# Patient Record
Sex: Female | Born: 1965 | Race: Black or African American | Hispanic: No | Marital: Single | Smoking: Never smoker
Health system: Southern US, Community
[De-identification: ages and names within clinical notes are randomized; demographics above are authoritative.]

## PROBLEM LIST (undated history)

## (undated) DIAGNOSIS — F419 Anxiety disorder, unspecified: Secondary | ICD-10-CM

## (undated) DIAGNOSIS — G4730 Sleep apnea, unspecified: Secondary | ICD-10-CM

## (undated) DIAGNOSIS — J45909 Unspecified asthma, uncomplicated: Secondary | ICD-10-CM

## (undated) DIAGNOSIS — K219 Gastro-esophageal reflux disease without esophagitis: Secondary | ICD-10-CM

## (undated) DIAGNOSIS — Z8719 Personal history of other diseases of the digestive system: Secondary | ICD-10-CM

## (undated) DIAGNOSIS — R7303 Prediabetes: Secondary | ICD-10-CM

## (undated) HISTORY — PX: FRACTURE SURGERY: SHX138

---

## 2020-05-23 ENCOUNTER — Other Ambulatory Visit: Payer: Self-pay | Admitting: Internal Medicine

## 2020-05-23 DIAGNOSIS — Z1231 Encounter for screening mammogram for malignant neoplasm of breast: Secondary | ICD-10-CM

## 2020-05-25 ENCOUNTER — Other Ambulatory Visit: Payer: Self-pay

## 2020-05-25 ENCOUNTER — Ambulatory Visit
Admission: RE | Admit: 2020-05-25 | Discharge: 2020-05-25 | Disposition: A | Discharge status: Home / Self Care | Payer: 59 | Source: Ambulatory Visit | Attending: Internal Medicine | Admitting: Internal Medicine

## 2020-05-25 DIAGNOSIS — Z1231 Encounter for screening mammogram for malignant neoplasm of breast: Secondary | ICD-10-CM

## 2020-10-21 ENCOUNTER — Other Ambulatory Visit: Payer: Self-pay

## 2020-10-21 ENCOUNTER — Ambulatory Visit (HOSPITAL_COMMUNITY)
Admission: EM | Admit: 2020-10-21 | Discharge: 2020-10-21 | Disposition: A | Discharge status: Home / Self Care | Payer: Managed Care, Other (non HMO) | Attending: Physician Assistant | Admitting: Physician Assistant

## 2020-10-21 ENCOUNTER — Encounter (HOSPITAL_COMMUNITY): Payer: Self-pay

## 2020-10-21 DIAGNOSIS — R109 Unspecified abdominal pain: Secondary | ICD-10-CM | POA: Diagnosis present

## 2020-10-21 DIAGNOSIS — R059 Cough, unspecified: Secondary | ICD-10-CM | POA: Insufficient documentation

## 2020-10-21 DIAGNOSIS — R197 Diarrhea, unspecified: Secondary | ICD-10-CM | POA: Diagnosis not present

## 2020-10-21 DIAGNOSIS — R509 Fever, unspecified: Secondary | ICD-10-CM | POA: Diagnosis not present

## 2020-10-21 DIAGNOSIS — Z20822 Contact with and (suspected) exposure to covid-19: Secondary | ICD-10-CM | POA: Insufficient documentation

## 2020-10-21 DIAGNOSIS — J069 Acute upper respiratory infection, unspecified: Secondary | ICD-10-CM

## 2020-10-21 LAB — SARS CORONAVIRUS 2 (TAT 6-24 HRS): SARS Coronavirus 2: NEGATIVE

## 2020-10-21 MED ORDER — PROMETHAZINE-DM 6.25-15 MG/5ML PO SYRP: 5.0000 mL | ORAL_SOLUTION | Freq: Two times a day (BID) | ORAL | 0 refills | Status: AC | PRN

## 2020-10-21 MED ORDER — BENZONATATE 100 MG PO CAPS: 100.0000 mg | ORAL_CAPSULE | Freq: Three times a day (TID) | ORAL | 0 refills | Status: DC

## 2020-10-21 MED ORDER — ACETAMINOPHEN 325 MG PO TABS
650.0000 mg | ORAL_TABLET | Freq: Once | ORAL | Status: AC
  Administered 2020-10-21: 650 mg via ORAL

## 2020-10-21 MED ORDER — ACETAMINOPHEN 325 MG PO TABS
ORAL_TABLET | ORAL | Status: AC
  Filled 2020-10-21: qty 2

## 2020-10-21 NOTE — ED Provider Notes (Signed)
[facility]    CSN: [id number] Arrival date & time: 10/21/20  1028      History   Chief Complaint Chief Complaint  Patient presents with  . Abdominal Pain  . Fever  . Nasal Congestion    HPI Sharon Sparks is a 55 y.o. female.   Patient presents today with a 5-day history of URI symptoms.  Reports fever with T-max of 102 F, body aches, cough, nasal congestion, decreased appetite, abdominal pain, diarrhea.  She denies any chest pain, shortness of breath, dizziness, syncope.  She has tried ibuprofen and TheraFlu without improvement of symptoms.  Denies any known sick contacts.  She does have a history of allergies but is taking allergy medication as prescribed.  Denies history of smoking, asthma, COPD.  She is up-to-date on influenza and COVID-19 vaccinations including boosters.  She denies any recent antibiotic use.  She has missed work as a result of symptoms.     History reviewed. No pertinent past medical history.  There are no problems to display for this patient.   History reviewed. No pertinent surgical history.  OB History   No obstetric history on file.      Home Medications    Prior to Admission medications   Medication Sig Start Date End Date Taking? Authorizing Provider  benzonatate (TESSALON) 100 MG capsule Take 1 capsule (100 mg total) by mouth every 8 (eight) hours. 10/21/20  Yes Tylar Merendino K, PA-C  promethazine-dextromethorphan (PROMETHAZINE-DM) 6.25-15 MG/5ML syrup Take 5 mLs by mouth 2 (two) times daily as needed for cough. 10/21/20  Yes Thayer Embleton, Noberto Retort, PA-C    Family History History reviewed. No pertinent family history.  Social History Social History   Tobacco Use  . Smoking status: Never Smoker  . Smokeless tobacco: Never Used  Substance Use Topics  . Drug use: Never     Allergies   Patient has no known allergies.   Review of Systems Review of Systems  Constitutional: Positive for activity change, appetite change, fatigue  and fever.  HENT: Positive for congestion, postnasal drip and sinus pressure. Negative for sneezing and sore throat.   Respiratory: Positive for cough. Negative for shortness of breath.   Cardiovascular: Negative for chest pain.  Gastrointestinal: Positive for abdominal pain and diarrhea. Negative for nausea and vomiting.  Musculoskeletal: Positive for arthralgias and myalgias.  Neurological: Positive for headaches. Negative for dizziness and light-headedness.     Physical Exam Triage Vital Signs ED Triage Vitals  Enc Vitals Group     BP 10/21/20 1114 135/77     Pulse Rate 10/21/20 1114 (!) 105     Resp 10/21/20 1114 20     Temp 10/21/20 1114 (!) 100.5 F (38.1 C)     Temp src --      SpO2 10/21/20 1114 97 %     Weight --      Height --      Head Circumference --      Peak Flow --      Pain Score 10/21/20 1112 6     Pain Loc --      Pain Edu? --      Excl. in GC? --    No data found.  Updated Vital Signs BP 135/77   Pulse (!) 105   Temp (!) 100.5 F (38.1 C)   Resp 20   SpO2 97%   Visual Acuity Right Eye Distance:   Left Eye Distance:   Bilateral Distance:    Right  Eye Near:   Left Eye Near:    Bilateral Near:     Physical Exam Vitals reviewed.  Constitutional:      General: She is awake. She is not in acute distress.    Appearance: Normal appearance. She is not ill-appearing.     Comments: Very pleasant female appears stated age in no acute distress  HENT:     Head: Normocephalic and atraumatic.     Right Ear: Ear canal and external ear normal. A middle ear effusion is present. Tympanic membrane is not erythematous or bulging.     Left Ear: Ear canal and external ear normal. A middle ear effusion is present. Tympanic membrane is not erythematous or bulging.     Nose:     Right Sinus: Maxillary sinus tenderness and frontal sinus tenderness present.     Left Sinus: Maxillary sinus tenderness and frontal sinus tenderness present.     Mouth/Throat:      Pharynx: Uvula midline. No oropharyngeal exudate or posterior oropharyngeal erythema.     Comments: Moderate drainage in posterior oropharynx Cardiovascular:     Rate and Rhythm: Normal rate and regular rhythm.     Heart sounds: No murmur heard.   Pulmonary:     Effort: Pulmonary effort is normal.     Breath sounds: Normal breath sounds. No wheezing, rhonchi or rales.     Comments: Clear to auscultation bilaterally Lymphadenopathy:     Head:     Right side of head: No submental, submandibular or tonsillar adenopathy.     Left side of head: No submental, submandibular or tonsillar adenopathy.     Cervical: No cervical adenopathy.  Psychiatric:        Behavior: Behavior is cooperative.      UC Treatments / Results  Labs (all labs ordered are listed, but only abnormal results are displayed) Labs Reviewed  SARS CORONAVIRUS 2 (TAT 6-24 HRS)    EKG   Radiology No results found.  Procedures Procedures (including critical care time)  Medications Ordered in UC Medications  acetaminophen (TYLENOL) tablet 650 mg (650 mg Oral Given 10/21/20 1136)    Initial Impression / Assessment and Plan / UC Course  I have reviewed the triage vital signs and the nursing notes.  Pertinent labs & imaging results that were available during my care of the patient were reviewed by me and considered in my medical decision making (see chart for details).     Vital signs and physical exam reassuring today.  Suspect viral etiology given short duration of symptoms.  No indication for flu testing given patient has been symptomatic for 5 days and this would not change management.  Will test for COVID-19-results pending.  She was prescribed Promethazine DM for nocturnal cough and was instructed not to drive or drink alcohol with this medication.  Can use Tessalon for diurnal cough symptoms.  Recommended she drink plenty of fluid and eat a bland diet.  Recommended over-the-counter medication for symptom  management.  She was provided a work excuse note.  Strict return precautions given to which patient expressed understanding.  Final Clinical Impressions(s) / UC Diagnoses   Final diagnoses:  Viral URI with cough     Discharge Instructions     Use Promethazine DM up to 2 times a day as needed for cough.  This will make you sleepy so do not drive or drink alcohol with it.  You should only take this when you are out home.  You can use Tessalon  for cough symptoms during the day.  I recommend Flonase and Mucinex for additional symptom relief.  If you continue to have symptoms for another 5 to 7 days would consider antibiotics so please return for reevaluation.  We will contact you if you are positive for COVID-19.  If anything worsens please come back to be seen again.    ED Prescriptions    Medication Sig Dispense Auth. Provider   promethazine-dextromethorphan (PROMETHAZINE-DM) 6.25-15 MG/5ML syrup Take 5 mLs by mouth 2 (two) times daily as needed for cough. 118 mL Temiloluwa Laredo K, PA-C   benzonatate (TESSALON) 100 MG capsule Take 1 capsule (100 mg total) by mouth every 8 (eight) hours. 21 capsule Amber Guthridge K, PA-C     PDMP not reviewed this encounter.   Jeani Hawking, PA-C 10/21/20 1159

## 2020-10-21 NOTE — Discharge Instructions (Signed)
Use Promethazine DM up to 2 times a day as needed for cough.  This will make you sleepy so do not drive or drink alcohol with it.  You should only take this when you are out home.  You can use Tessalon for cough symptoms during the day.  I recommend Flonase and Mucinex for additional symptom relief.  If you continue to have symptoms for another 5 to 7 days would consider antibiotics so please return for reevaluation.  We will contact you if you are positive for COVID-19.  If anything worsens please come back to be seen again.

## 2020-10-21 NOTE — ED Triage Notes (Addendum)
Pt in with c/o congestion, fever tmax 101, and abdominal pain that has been going on since Monday. Pt also c/o vomiting and diarrhea  Pt took 800 mg ibuprofen around 8 am today, pt has also been taking theraflu

## 2021-07-04 ENCOUNTER — Other Ambulatory Visit: Payer: Self-pay

## 2021-07-04 ENCOUNTER — Emergency Department (HOSPITAL_BASED_OUTPATIENT_CLINIC_OR_DEPARTMENT_OTHER): Payer: Managed Care, Other (non HMO) | Admitting: Radiology

## 2021-07-04 ENCOUNTER — Encounter (HOSPITAL_BASED_OUTPATIENT_CLINIC_OR_DEPARTMENT_OTHER): Payer: Self-pay

## 2021-07-04 ENCOUNTER — Emergency Department (HOSPITAL_BASED_OUTPATIENT_CLINIC_OR_DEPARTMENT_OTHER)
Admission: EM | Admit: 2021-07-04 | Discharge: 2021-07-04 | Disposition: A | Discharge status: Home / Self Care | Payer: Managed Care, Other (non HMO) | Attending: Emergency Medicine | Admitting: Emergency Medicine

## 2021-07-04 DIAGNOSIS — J069 Acute upper respiratory infection, unspecified: Secondary | ICD-10-CM | POA: Diagnosis not present

## 2021-07-04 DIAGNOSIS — Z20822 Contact with and (suspected) exposure to covid-19: Secondary | ICD-10-CM | POA: Diagnosis not present

## 2021-07-04 DIAGNOSIS — R059 Cough, unspecified: Secondary | ICD-10-CM | POA: Diagnosis present

## 2021-07-04 LAB — RESP PANEL BY RT-PCR (FLU A&B, COVID) ARPGX2
Influenza A by PCR: NEGATIVE
Influenza B by PCR: NEGATIVE
SARS Coronavirus 2 by RT PCR: NEGATIVE

## 2021-07-04 MED ORDER — BENZONATATE 100 MG PO CAPS: 100.0000 mg | ORAL_CAPSULE | Freq: Once | ORAL | Status: DC

## 2021-07-04 MED ORDER — BENZONATATE 100 MG PO CAPS: 100.0000 mg | ORAL_CAPSULE | Freq: Three times a day (TID) | ORAL | 0 refills | Status: AC

## 2021-07-04 MED ORDER — IPRATROPIUM-ALBUTEROL 0.5-2.5 (3) MG/3ML IN SOLN
3.0000 mL | Freq: Once | RESPIRATORY_TRACT | Status: AC
  Administered 2021-07-04: 3 mL via RESPIRATORY_TRACT
  Filled 2021-07-04: qty 3

## 2021-07-04 MED ORDER — ACETAMINOPHEN 500 MG PO TABS
1000.0000 mg | ORAL_TABLET | Freq: Once | ORAL | Status: AC
  Administered 2021-07-04: 1000 mg via ORAL
  Filled 2021-07-04: qty 2

## 2021-07-04 MED ORDER — HYDROCOD POLST-CPM POLST ER 10-8 MG/5ML PO SUER
5.0000 mL | Freq: Once | ORAL | Status: AC
Start: 2021-07-04 — End: 2021-07-04
  Administered 2021-07-04: 5 mL via ORAL
  Filled 2021-07-04: qty 5

## 2021-07-04 NOTE — ED Triage Notes (Signed)
Patient here POV from Home with Flu-Like Symptoms.  Patient has had Cough, Body Aches, Fever, Mild Nausea for a few days.   NAD Noted during Triage. A&Ox4. GCS 15. Ambulatory.

## 2021-07-04 NOTE — ED Provider Notes (Signed)
[facility] Provider Note   CSN: [id number] Arrival date & time: 07/04/21  1335     History  Chief Complaint  Patient presents with   Cough    Hli Rent is a 55 y.o. female with a past medical history of asthma presenting today with a complaint of URI symptoms since Friday.  On Friday she began to have nasal congestion, headache and measured a fever of 103.  This continued into Saturday and Sunday.  On Sunday she began to have a cough that she describes as dry.  Has been using over-the-counter medications such as TheraFlu, Alka-Seltzer, Tylenol and teas.  Has not been using her inhaler more than usual.  No known sick contacts.  No recent surgery, recent travel, estrogen use or history of DVT.   Home Medications Prior to Admission medications   Medication Sig Start Date End Date Taking? Authorizing Provider  benzonatate (TESSALON) 100 MG capsule Take 1 capsule (100 mg total) by mouth every 8 (eight) hours. Patient not taking: Reported on 07/04/2021 10/21/20   Raspet, Derry Skill, PA-C  promethazine-dextromethorphan (PROMETHAZINE-DM) 6.25-15 MG/5ML syrup Take 5 mLs by mouth 2 (two) times daily as needed for cough. Patient not taking: Reported on 07/04/2021 10/21/20   Raspet, Derry Skill, PA-C      Allergies    Patient has no known allergies.    Review of Systems   Review of Systems  Constitutional:  Positive for chills and fever.  HENT:  Positive for congestion and sore throat. Negative for trouble swallowing.   Respiratory:  Positive for cough. Negative for shortness of breath.   Cardiovascular:  Negative for chest pain and palpitations.  Musculoskeletal:  Negative for myalgias.   Physical Exam Updated Vital Signs BP (!) 162/83    Pulse 94    Temp 99.4 F (37.4 C) (Oral)    Resp 18    Ht 5\' 4"  (1.626 m)    Wt 108 kg    SpO2 95%    BMI 40.85 kg/m  Physical Exam Vitals and nursing note reviewed.  Constitutional:      General: She is not in acute distress.     Appearance: Normal appearance. She is not ill-appearing.  HENT:     Head: Normocephalic and atraumatic.     Nose: Congestion present.     Mouth/Throat:     Mouth: Mucous membranes are moist.     Pharynx: Oropharynx is clear.  Eyes:     General: No scleral icterus.    Conjunctiva/sclera: Conjunctivae normal.  Cardiovascular:     Rate and Rhythm: Normal rate and regular rhythm.  Pulmonary:     Effort: Pulmonary effort is normal. No respiratory distress.     Breath sounds: No wheezing or rales.  Lymphadenopathy:     Cervical: No cervical adenopathy.  Skin:    General: Skin is warm and dry.     Findings: No rash.  Neurological:     Mental Status: She is alert.  Psychiatric:        Mood and Affect: Mood normal.    ED Results / Procedures / Treatments   Labs (all labs ordered are listed, but only abnormal results are displayed) Labs Reviewed  RESP PANEL BY RT-PCR (FLU A&B, COVID) [id number]    EKG None  Radiology DG Chest 2 View  Result Date: 07/04/2021 CLINICAL DATA:  Cough EXAM: CHEST - 2 VIEW COMPARISON:  None. FINDINGS: The heart size and mediastinal contours are within normal limits. Both lungs are  clear. No pleural effusion. The visualized skeletal structures are unremarkable. IMPRESSION: No active cardiopulmonary disease. Electronically Signed   By: Macy Mis M.D.   On: 07/04/2021 14:10    Procedures Procedures   Medications Ordered in ED Medications  ipratropium-albuterol (DUONEB) 0.5-2.5 (3) MG/3ML nebulizer solution 3 mL (3 mLs Nebulization Given 07/04/21 1942)  chlorpheniramine-HYDROcodone (TUSSIONEX) 10-8 MG/5ML suspension 5 mL (5 mLs Oral Given 07/04/21 1940)  acetaminophen (TYLENOL) tablet 1,000 mg (1,000 mg Oral Given 07/04/21 2057)    ED Course/ Medical Decision Making/ A&P                           Medical Decision Making  Patient presents to the ED for concern of URI sx, this involves an extensive number of treatment options, and is a complaint that  carries with it a high risk of complications and morbidity.  The differential diagnosis includes but is not limited to COVID, flu, PE, pneumonia, bronchitis, pneumothorax, asthma exacerbation.   Co morbidities that complicate the patient evaluation  Asthma   Lab Tests:  I Ordered, and personally interpreted labs.  The pertinent results include a negative COVID and flu test.  Imaging Studies ordered:  I ordered imaging studies including Chest xray I independently visualized and interpreted imaging which was negative.  I agree with radiologist.   Medicines ordered and prescription drug management:  I ordered medication including DuoNeb and Tussinex for her symptoms. Reevaluation of the patient after these medicines showed that the patient stayed the same I have reviewed the patients home medicines and have made adjustments as needed   Test Considered:  D dimer.  Patient has no risk factors for DVT and cough is in the context of other URI symptoms.  Low suspicion and D-dimer deferred.   Problem List / ED Course:  URI  Dispostion:  After consideration of the diagnostic results and the patients response to treatment, I feel that the patent would benefit from Mucinex and Tessalon Perles for her symptoms.  She will continue to try and treat her symptoms with over-the-counter medications and return with any worsening.  She has also been given a primary care provider to follow-up with  Final Clinical Impression(s) / ED Diagnoses Final diagnoses:  Viral upper respiratory tract infection    Rx / DC Orders Results and diagnoses were explained to the patient. Return precautions discussed in full. Patient had no additional questions and expressed complete understanding.   This chart was dictated using voice recognition software.  Despite best efforts to proofread,  errors can occur which can change the documentation meaning.    Rhae Hammock, PA-C 07/04/21 2155    Deno Etienne, DO 07/04/21 2300

## 2021-07-04 NOTE — ED Notes (Signed)
Pt verbalizes understanding of discharge instructions. Opportunity for questioning and answers were provided. Pt discharged from ED to home.   ? ?

## 2021-07-04 NOTE — Discharge Instructions (Addendum)
Mucinex is a good medication for nasal congestion.  Tessalon Perles, benzonatate, as the other medication I sent to the pharmacy.  This can be helpful for a cough.  Robitussin is also a good option for a cough.  Follow-up with the clinic attached to these papers as needed and return if your symptoms worsen.

## 2021-08-22 ENCOUNTER — Ambulatory Visit: Payer: Managed Care, Other (non HMO) | Admitting: Emergency Medicine

## 2021-09-05 ENCOUNTER — Other Ambulatory Visit: Payer: Self-pay | Admitting: Otolaryngology

## 2021-09-05 DIAGNOSIS — J342 Deviated nasal septum: Secondary | ICD-10-CM

## 2021-09-05 DIAGNOSIS — G4733 Obstructive sleep apnea (adult) (pediatric): Secondary | ICD-10-CM

## 2021-09-05 DIAGNOSIS — J343 Hypertrophy of nasal turbinates: Secondary | ICD-10-CM

## 2021-09-05 DIAGNOSIS — R1314 Dysphagia, pharyngoesophageal phase: Secondary | ICD-10-CM

## 2021-09-05 DIAGNOSIS — K219 Gastro-esophageal reflux disease without esophagitis: Secondary | ICD-10-CM

## 2021-09-12 ENCOUNTER — Other Ambulatory Visit: Payer: Managed Care, Other (non HMO)

## 2021-09-29 ENCOUNTER — Ambulatory Visit
Admission: RE | Admit: 2021-09-29 | Discharge: 2021-09-29 | Disposition: A | Discharge status: Home / Self Care | Payer: Managed Care, Other (non HMO) | Source: Ambulatory Visit | Attending: Otolaryngology | Admitting: Otolaryngology

## 2021-09-29 DIAGNOSIS — J343 Hypertrophy of nasal turbinates: Secondary | ICD-10-CM

## 2021-09-29 DIAGNOSIS — Z9989 Dependence on other enabling machines and devices: Secondary | ICD-10-CM

## 2021-09-29 DIAGNOSIS — J342 Deviated nasal septum: Secondary | ICD-10-CM

## 2021-09-29 DIAGNOSIS — R1314 Dysphagia, pharyngoesophageal phase: Secondary | ICD-10-CM

## 2021-09-29 DIAGNOSIS — K219 Gastro-esophageal reflux disease without esophagitis: Secondary | ICD-10-CM

## 2021-11-14 NOTE — H&P (Signed)
HPI:  ? ?Sharon Sparks is a 56 y.o. female who presents as a new Patient.  ? ?Referring Provider: Self, A Referral ? ?Chief complaint: Nasal obstruction, difficulty swallowing. ? ?HPI: She has a history of nasal trauma as a teenager and had some sort of reconstructive surgery at the time. Since then she is finding it very difficult to breathe through the right side of her nose. She was diagnosed with sleep apnea last year and started on CPAP which she is using effectively every night. She has nasal CPAP and seems to do well with that. She also has a problem with swallowing. She has food getting stuck in her throat frequently. She had an upper and lower endoscopy a few years ago which was reportedly normal. She does suffer with chronic heartburn. She drinks 2 large cups of coffee every morning and wine periodically. She does not take any reflux medication. ? ?PMH/Meds/All/SocHx/FamHx/ROS:  ? ?History reviewed. No pertinent past medical history. ? ?History reviewed. No pertinent surgical history. ? ?No family history of bleeding disorders, wound healing problems or difficulty with anesthesia.  ? ?Social History  ? ?Socioeconomic History  ? Marital status: Single  ?Spouse name: Not on file  ? Number of children: Not on file  ? Years of education: Not on file  ? Highest education level: Not on file  ?Occupational History  ? Not on file  ?Tobacco Use  ? Smoking status: Never  ? Smokeless tobacco: Never  ?Substance and Sexual Activity  ? Alcohol use: Not Currently  ? Drug use: Not on file  ? Sexual activity: Not on file  ?Other Topics Concern  ? Not on file  ?Social History Narrative  ? Not on file  ? ?Social Determinants of Health  ? ?Financial Resource Strain: Not on file  ?Food Insecurity: Not on file  ?Transportation Needs: Not on file  ?Physical Activity: Not on file  ?Stress: Not on file  ?Social Connections: Not on file  ?Housing Stability: Not on file  ? ?Current Outpatient Medications:  ? buPROPion XL (WELLBUTRIN  XL) 150 MG 24 hr tablet, Take 1 tablet (150 mg total) by mouth every morning., Disp: , Rfl:  ? levocetirizine (XYZAL) 5 MG tablet, Take 1 tablet (5 mg total) by mouth nightly., Disp: , Rfl:  ? ?A complete ROS was performed with pertinent positives/negatives noted in the HPI. The remainder of the ROS are negative. ? ? ?Physical Exam:  ? ?Temp (!) 95.9 ?F (35.5 ?C)  Ht 1.626 m (5\' 4" )  Wt 109.5 kg (241 lb 6.4 oz)  BMI 41.44 kg/m?  ? ?General: Obese lady, in no distress, breathing easily. Normal affect. In a pleasant mood. ?Head: Normocephalic, atraumatic. No masses, or scars. ?Eyes: Pupils are equal, and reactive to light. Vision is grossly intact. No spontaneous or gaze nystagmus. ?Ears: Ear canals are clear. Tympanic membranes are intact, with normal landmarks and the middle ears are clear and healthy. ?Hearing: Grossly normal. ?Nose: Nasal cavities reveal a leftward posterior septal defect, with very large inferior turbinates causing obstruction bilaterally. Mucosa looks healthy and there is no exudate or polyps. ?Face: No masses or scars, facial nerve function is symmetric. ?Oral Cavity: No mucosal abnormalities are noted. Tongue with normal mobility. Dentition appears healthy. ?Oropharynx: Tonsils are symmetric. There are no mucosal masses identified. Tongue base appears normal and healthy. ?Larynx/Hypopharynx: deferred ?Chest: Deferred ?Neck: No palpable masses, no cervical adenopathy, no thyroid nodules or enlargement. ?Neuro: Cranial nerves II-XII with normal function. ?Balance: Normal gate. ?Other  findings: none. ? ?Independent Review of Additional Tests or Records:  ?none ? ?Procedures:  ?none ? ?Impression & Plans:  ?1. Obstructive sleep apnea syndrome. This is multifactorial including secondary to obesity and currently being treated effectively with CPAP. I would like to review the results of her sleep study but it is not available in our computer system. ? ?2. Nasal obstruction. She may benefit with  nasal septal/turbinate surgery but first I would like to review the sleep study and make sure that her sleep apnea is not severe and that this would not be a high risk surgical procedure. ? ?3. Dysphagia, recommend barium swallow. This is likely reflux related. We discussed the importance of cutting back on caffeine.We discussed causes of reflux, including lifestyle and dietary factors. Recommend strict avoidance of all tobacco, caffeine, alcohol, chocolate and peppermint. A reflux handout with more detailed instructions was provided to the patient.  ?

## 2021-12-26 ENCOUNTER — Encounter (HOSPITAL_COMMUNITY): Payer: Self-pay | Admitting: Otolaryngology

## 2021-12-26 ENCOUNTER — Other Ambulatory Visit: Payer: Self-pay

## 2021-12-27 ENCOUNTER — Ambulatory Visit (HOSPITAL_BASED_OUTPATIENT_CLINIC_OR_DEPARTMENT_OTHER): Payer: Managed Care, Other (non HMO) | Admitting: Anesthesiology

## 2021-12-27 ENCOUNTER — Observation Stay (HOSPITAL_COMMUNITY)
Admission: RE | Admit: 2021-12-27 | Discharge: 2021-12-28 | Disposition: A | Discharge status: Home / Self Care | Payer: Managed Care, Other (non HMO) | Attending: Otolaryngology | Admitting: Otolaryngology

## 2021-12-27 ENCOUNTER — Encounter (HOSPITAL_COMMUNITY)
Admission: RE | Disposition: A | Discharge status: Home / Self Care | Payer: Self-pay | Source: Home / Self Care | Attending: Otolaryngology

## 2021-12-27 ENCOUNTER — Other Ambulatory Visit: Payer: Self-pay

## 2021-12-27 ENCOUNTER — Encounter (HOSPITAL_COMMUNITY): Payer: Self-pay | Admitting: Otolaryngology

## 2021-12-27 ENCOUNTER — Ambulatory Visit (HOSPITAL_COMMUNITY): Payer: Managed Care, Other (non HMO) | Admitting: Anesthesiology

## 2021-12-27 DIAGNOSIS — J342 Deviated nasal septum: Principal | ICD-10-CM | POA: Insufficient documentation

## 2021-12-27 DIAGNOSIS — R7309 Other abnormal glucose: Secondary | ICD-10-CM | POA: Insufficient documentation

## 2021-12-27 DIAGNOSIS — Z9989 Dependence on other enabling machines and devices: Secondary | ICD-10-CM | POA: Diagnosis not present

## 2021-12-27 DIAGNOSIS — G4733 Obstructive sleep apnea (adult) (pediatric): Secondary | ICD-10-CM

## 2021-12-27 DIAGNOSIS — J343 Hypertrophy of nasal turbinates: Secondary | ICD-10-CM

## 2021-12-27 DIAGNOSIS — R1310 Dysphagia, unspecified: Secondary | ICD-10-CM | POA: Diagnosis not present

## 2021-12-27 HISTORY — DX: Sleep apnea, unspecified: G47.30

## 2021-12-27 HISTORY — DX: Unspecified asthma, uncomplicated: J45.909

## 2021-12-27 HISTORY — DX: Prediabetes: R73.03

## 2021-12-27 HISTORY — DX: Personal history of other diseases of the digestive system: Z87.19

## 2021-12-27 HISTORY — PX: NASAL SEPTOPLASTY W/ TURBINOPLASTY: SHX2070

## 2021-12-27 HISTORY — DX: Anxiety disorder, unspecified: F41.9

## 2021-12-27 HISTORY — DX: Gastro-esophageal reflux disease without esophagitis: K21.9

## 2021-12-27 LAB — CBC
HCT: 36.3 % (ref 36.0–46.0)
Hemoglobin: 11.9 g/dL — ABNORMAL LOW (ref 12.0–15.0)
MCH: 30 pg (ref 26.0–34.0)
MCHC: 32.8 g/dL (ref 30.0–36.0)
MCV: 91.4 fL (ref 80.0–100.0)
Platelets: 244 10*3/uL (ref 150–400)
RBC: 3.97 MIL/uL (ref 3.87–5.11)
RDW: 13.3 % (ref 11.5–15.5)
WBC: 4.1 10*3/uL (ref 4.0–10.5)
nRBC: 0 % (ref 0.0–0.2)

## 2021-12-27 LAB — GLUCOSE, CAPILLARY: Glucose-Capillary: 152 mg/dL — ABNORMAL HIGH (ref 70–99)

## 2021-12-27 SURGERY — SEPTOPLASTY, NOSE, WITH NASAL TURBINATE REDUCTION
Anesthesia: General | Site: Nose | Laterality: Bilateral

## 2021-12-27 MED ORDER — OXYMETAZOLINE HCL 0.05 % NA SOLN
2.0000 | NASAL | Status: DC
  Administered 2021-12-27: 2 via NASAL
  Filled 2021-12-27: qty 30

## 2021-12-27 MED ORDER — ONDANSETRON HCL 4 MG PO TABS: 4.0000 mg | ORAL_TABLET | ORAL | Status: DC | PRN

## 2021-12-27 MED ORDER — CHLORHEXIDINE GLUCONATE 0.12 % MT SOLN: 15.0000 mL | Freq: Once | OROMUCOSAL | Status: AC

## 2021-12-27 MED ORDER — ACETAMINOPHEN 500 MG PO TABS: 1000.0000 mg | ORAL_TABLET | Freq: Once | ORAL | Status: AC

## 2021-12-27 MED ORDER — OXYMETAZOLINE HCL 0.05 % NA SOLN
NASAL | Status: DC | PRN
  Administered 2021-12-27: 1

## 2021-12-27 MED ORDER — BACITRACIN ZINC 500 UNIT/GM EX OINT
TOPICAL_OINTMENT | CUTANEOUS | Status: AC
  Filled 2021-12-27: qty 28.35

## 2021-12-27 MED ORDER — LIDOCAINE-EPINEPHRINE 1 %-1:100000 IJ SOLN
INTRAMUSCULAR | Status: AC
  Filled 2021-12-27: qty 1

## 2021-12-27 MED ORDER — LACTATED RINGERS IV SOLN: INTRAVENOUS | Status: DC

## 2021-12-27 MED ORDER — ACETAMINOPHEN 500 MG PO TABS
ORAL_TABLET | ORAL | Status: AC
  Administered 2021-12-27: 1000 mg via ORAL
  Filled 2021-12-27: qty 2

## 2021-12-27 MED ORDER — ONDANSETRON HCL 4 MG/2ML IJ SOLN
INTRAMUSCULAR | Status: DC | PRN
  Administered 2021-12-27: 4 mg via INTRAVENOUS

## 2021-12-27 MED ORDER — CHLORHEXIDINE GLUCONATE 0.12 % MT SOLN
OROMUCOSAL | Status: AC
  Administered 2021-12-27: 15 mL via OROMUCOSAL
  Filled 2021-12-27: qty 15

## 2021-12-27 MED ORDER — PHENYLEPHRINE HCL-NACL 20-0.9 MG/250ML-% IV SOLN
INTRAVENOUS | Status: DC | PRN
  Administered 2021-12-27: 10 ug/min via INTRAVENOUS

## 2021-12-27 MED ORDER — ORAL CARE MOUTH RINSE: 15.0000 mL | Freq: Once | OROMUCOSAL | Status: AC

## 2021-12-27 MED ORDER — HYDROCODONE-ACETAMINOPHEN 7.5-325 MG PO TABS: 1.0000 | ORAL_TABLET | Freq: Four times a day (QID) | ORAL | 0 refills | Status: AC | PRN

## 2021-12-27 MED ORDER — FENTANYL CITRATE (PF) 250 MCG/5ML IJ SOLN
INTRAMUSCULAR | Status: DC | PRN
Start: 2021-12-27 — End: 2021-12-27
  Administered 2021-12-27: 50 ug via INTRAVENOUS
  Administered 2021-12-27: 100 ug via INTRAVENOUS

## 2021-12-27 MED ORDER — FENTANYL CITRATE (PF) 250 MCG/5ML IJ SOLN
INTRAMUSCULAR | Status: AC
  Filled 2021-12-27: qty 5

## 2021-12-27 MED ORDER — IBUPROFEN 100 MG/5ML PO SUSP
400.0000 mg | Freq: Four times a day (QID) | ORAL | Status: DC | PRN
  Administered 2021-12-27: 400 mg via ORAL
  Filled 2021-12-27: qty 20

## 2021-12-27 MED ORDER — ONDANSETRON HCL 4 MG/2ML IJ SOLN: 4.0000 mg | INTRAMUSCULAR | Status: DC | PRN

## 2021-12-27 MED ORDER — OXYMETAZOLINE HCL 0.05 % NA SOLN
NASAL | Status: AC
  Filled 2021-12-27: qty 30

## 2021-12-27 MED ORDER — LORATADINE 10 MG PO TABS
5.0000 mg | ORAL_TABLET | Freq: Every day | ORAL | Status: DC
Start: 2021-12-27 — End: 2021-12-27

## 2021-12-27 MED ORDER — PROPOFOL 10 MG/ML IV BOLUS
INTRAVENOUS | Status: DC | PRN
  Administered 2021-12-27: 160 mg via INTRAVENOUS

## 2021-12-27 MED ORDER — DEXAMETHASONE SODIUM PHOSPHATE 10 MG/ML IJ SOLN
INTRAMUSCULAR | Status: DC | PRN
  Administered 2021-12-27: 10 mg via INTRAVENOUS

## 2021-12-27 MED ORDER — FENTANYL CITRATE (PF) 100 MCG/2ML IJ SOLN
25.0000 ug | INTRAMUSCULAR | Status: DC | PRN
  Administered 2021-12-27: 25 ug via INTRAVENOUS

## 2021-12-27 MED ORDER — MIDAZOLAM HCL 2 MG/2ML IJ SOLN
INTRAMUSCULAR | Status: DC | PRN
  Administered 2021-12-27: 2 mg via INTRAVENOUS

## 2021-12-27 MED ORDER — ONDANSETRON HCL 4 MG/2ML IJ SOLN: 4.0000 mg | Freq: Once | INTRAMUSCULAR | Status: DC | PRN

## 2021-12-27 MED ORDER — HYDROCODONE-ACETAMINOPHEN 5-325 MG PO TABS
1.0000 | ORAL_TABLET | ORAL | Status: DC | PRN
  Administered 2021-12-27: 2 via ORAL
  Filled 2021-12-27: qty 2
  Filled 2021-12-27: qty 1

## 2021-12-27 MED ORDER — ROCURONIUM BROMIDE 10 MG/ML (PF) SYRINGE
PREFILLED_SYRINGE | INTRAVENOUS | Status: DC | PRN
  Administered 2021-12-27: 60 mg via INTRAVENOUS

## 2021-12-27 MED ORDER — DEXTROSE-NACL 5-0.9 % IV SOLN: INTRAVENOUS | Status: DC

## 2021-12-27 MED ORDER — FENTANYL CITRATE (PF) 100 MCG/2ML IJ SOLN
INTRAMUSCULAR | Status: AC
  Filled 2021-12-27: qty 2

## 2021-12-27 MED ORDER — SUGAMMADEX SODIUM 200 MG/2ML IV SOLN
INTRAVENOUS | Status: DC | PRN
  Administered 2021-12-27: 200 mg via INTRAVENOUS

## 2021-12-27 MED ORDER — LIDOCAINE 2% (20 MG/ML) 5 ML SYRINGE
INTRAMUSCULAR | Status: DC | PRN
  Administered 2021-12-27: 100 mg via INTRAVENOUS

## 2021-12-27 MED ORDER — ONDANSETRON 4 MG PO TBDP: 4.0000 mg | ORAL_TABLET | Freq: Three times a day (TID) | ORAL | 0 refills | Status: AC | PRN

## 2021-12-27 MED ORDER — LIDOCAINE-EPINEPHRINE 1 %-1:100000 IJ SOLN
INTRAMUSCULAR | Status: DC | PRN
  Administered 2021-12-27: 4 mL

## 2021-12-27 MED ORDER — PHENYLEPHRINE 80 MCG/ML (10ML) SYRINGE FOR IV PUSH (FOR BLOOD PRESSURE SUPPORT)
PREFILLED_SYRINGE | INTRAVENOUS | Status: DC | PRN
  Administered 2021-12-27: 40 ug via INTRAVENOUS

## 2021-12-27 MED ORDER — BACITRACIN ZINC 500 UNIT/GM EX OINT
TOPICAL_OINTMENT | CUTANEOUS | Status: DC | PRN
  Administered 2021-12-27: 1 via TOPICAL

## 2021-12-27 MED ORDER — MIDAZOLAM HCL 2 MG/2ML IJ SOLN
INTRAMUSCULAR | Status: AC
  Filled 2021-12-27: qty 2

## 2021-12-27 MED ORDER — LORATADINE 10 MG PO TABS
10.0000 mg | ORAL_TABLET | Freq: Every day | ORAL | Status: DC
  Administered 2021-12-27: 10 mg via ORAL
  Filled 2021-12-27: qty 1

## 2021-12-27 MED ORDER — BUPROPION HCL ER (XL) 150 MG PO TB24: 300.0000 mg | ORAL_TABLET | Freq: Every morning | ORAL | Status: DC

## 2021-12-27 MED ORDER — PROPOFOL 10 MG/ML IV BOLUS
INTRAVENOUS | Status: AC
  Filled 2021-12-27: qty 20

## 2021-12-27 SURGICAL SUPPLY — 23 items
BAG COUNTER SPONGE SURGICOUNT (BAG) ×2 IMPLANT
CANISTER SUCT 3000ML PPV (MISCELLANEOUS) ×2 IMPLANT
COAGULATOR SUCT SWTCH 10FR 6 (ELECTROSURGICAL) IMPLANT
DRSG TELFA 3X8 NADH (GAUZE/BANDAGES/DRESSINGS) ×2 IMPLANT
GAUZE SPONGE 2X2 8PLY STRL LF (GAUZE/BANDAGES/DRESSINGS) ×1 IMPLANT
GLOVE ECLIPSE 7.5 STRL STRAW (GLOVE) ×2 IMPLANT
GOWN STRL REUS W/ TWL LRG LVL3 (GOWN DISPOSABLE) ×2 IMPLANT
GOWN STRL REUS W/TWL LRG LVL3 (GOWN DISPOSABLE) ×2
KIT BASIN OR (CUSTOM PROCEDURE TRAY) ×2 IMPLANT
KIT TURNOVER KIT B (KITS) ×2 IMPLANT
NDL PRECISIONGLIDE 27X1.5 (NEEDLE) ×1 IMPLANT
NEEDLE PRECISIONGLIDE 27X1.5 (NEEDLE) ×2 IMPLANT
NS IRRIG 1000ML POUR BTL (IV SOLUTION) ×2 IMPLANT
PAD ARMBOARD 7.5X6 YLW CONV (MISCELLANEOUS) ×4 IMPLANT
PAD DRESSING TELFA 3X8 NADH (GAUZE/BANDAGES/DRESSINGS) ×1 IMPLANT
PATTIES SURGICAL .5 X3 (DISPOSABLE) ×2 IMPLANT
POSITIONER HEAD DONUT 9IN (MISCELLANEOUS) ×2 IMPLANT
SPONGE GAUZE 2X2 STER 10/PKG (GAUZE/BANDAGES/DRESSINGS) ×1
SUT CHROMIC 4 0 P 3 18 (SUTURE) ×2 IMPLANT
SUT ETHILON 3 0 PS 1 (SUTURE) IMPLANT
SUT PLAIN 4 0 ~~LOC~~ 1 (SUTURE) ×2 IMPLANT
TOWEL GREEN STERILE FF (TOWEL DISPOSABLE) ×2 IMPLANT
TRAY ENT MC OR (CUSTOM PROCEDURE TRAY) ×2 IMPLANT

## 2021-12-27 NOTE — Interval H&P Note (Signed)
History and Physical Interval Note:  12/27/2021 11:14 AM  Sharon Sparks  has presented today for surgery, with the diagnosis of Nasal septal deviation; Nasal turbinate hypertrophy.  The various methods of treatment have been discussed with the patient and family. After consideration of risks, benefits and other options for treatment, the patient has consented to  Procedure(s): NASAL SEPTOPLASTY WITH TURBINATE REDUCTION (Bilateral) as a surgical intervention.  The patient's history has been reviewed, patient examined, no change in status, stable for surgery.  I have reviewed the patient's chart and labs.  Questions were answered to the patient's satisfaction.     [name]

## 2021-12-27 NOTE — Anesthesia Procedure Notes (Signed)
Procedure Name: Intubation Date/Time: 12/27/2021 11:33 AM  Performed by: Kara Mead, CRNAPre-anesthesia Checklist: Patient identified, Emergency Drugs available, Suction available and Patient being monitored Patient Re-evaluated:Patient Re-evaluated prior to induction Oxygen Delivery Method: Circle system utilized Preoxygenation: Pre-oxygenation with 100% oxygen Induction Type: IV induction Ventilation: Mask ventilation without difficulty Laryngoscope Size: Glidescope and 3 Grade View: Grade I Tube type: Oral Tube size: 7.0 mm Number of attempts: 1 Airway Equipment and Method: Stylet and Oral airway Placement Confirmation: ETT inserted through vocal cords under direct vision, positive ETCO2 and breath sounds checked- equal and bilateral Secured at: 22 cm Tube secured with: Tape Dental Injury: Teeth and Oropharynx as per pre-operative assessment

## 2021-12-27 NOTE — Transfer of Care (Signed)
Immediate Anesthesia Transfer of Care Note  Patient: Sharon Sparks  Procedure(s) Performed: NASAL SEPTOPLASTY WITH TURBINATE REDUCTION (Bilateral: Nose)  Patient Location: PACU  Anesthesia Type:General  Level of Consciousness: awake, alert , oriented, patient cooperative and responds to stimulation  Airway & Oxygen Therapy: Patient Spontanous Breathing and Patient connected to face mask oxygen  Post-op Assessment: Report given to RN and Post -op Vital signs reviewed and stable  Post vital signs: Reviewed and stable  Last Vitals:  Vitals Value Taken Time  BP 155/94 12/27/21 1216  Temp    Pulse 83 12/27/21 1219  Resp 13 12/27/21 1219  SpO2 100 % 12/27/21 1219  Vitals shown include unvalidated device data.  Last Pain:  Vitals:   12/27/21 1005  TempSrc:   PainSc: 0-No pain         Complications: No notable events documented.

## 2021-12-27 NOTE — Op Note (Signed)
OPERATIVE REPORT  DATE OF SURGERY: 12/27/2021  PATIENT:  Sharon Sparks,  56 y.o. female  PRE-OPERATIVE DIAGNOSIS:  Nasal septal deviation; Nasal turbinate hypertrophy  POST-OPERATIVE DIAGNOSIS:  Nasal septal deviation; Nasal turbinate hypertrophy  PROCEDURE:  Procedure(s): NASAL SEPTOPLASTY WITH TURBINATE REDUCTION  SURGEON:  Susy Frizzle, MD  ASSISTANTS: none  ANESTHESIA:   General   EBL: 50 ml  DRAINS: none  LOCAL MEDICATIONS USED:  1% Xylocaine with epinephrine  SPECIMEN:  none  COUNTS:  Correct  PROCEDURE DETAILS: The patient was taken to the operating room and placed on the operating table in the supine position. Following induction of general endotracheal anesthesia, the nose was prepped and draped in a standard fashion. Afrin spray was used preoperatively in the holding area. 1% Xylocaine with epinephrine was infiltrated into the septum, the columella, and the inferior turbinates bilaterally.  1. Nasal septoplasty. A left hemitransfixion incision was created with a 15 scalpel. A mucoperichondrial flap was developed posteriorly down the left side of the nasal septum using a Cottle elevator. This was performed all the way to the sphenoid rostrum. The bony cartilaginous junction was divided and a similar flap was developed down the right side. The ethmoid plate was deflected towards the right and was mostly resected.  There is an additional deflection of the caudal cartilage.  This was dissected free from the columella and trimmed back to the date this rightward deflection.  2. Submucous resection inferior turbinates bilaterally.  Inferior turbinates were very large bilaterally.  The leading edge of the inferior turbinates were incised in a vertical fashion with a #15 scalpel. The Cottle elevator was used to elevate mucosa off bone in all directions. Fragments of the turbinate bone were resected with a Takahashi forceps. The turbinate remnants were outfractured with the Market researcher.  All of these maneuvers greatly increased the nasal airways bilaterally. The nasal cavities were packed with rolled up Telfa coated with bacitracin ointment. The pharynx was suctioned of blood and secretions under direct visualization. The patient was awakened extubated and transferred to recovery in stable condition.    PATIENT DISPOSITION:  To PACU, stable

## 2021-12-27 NOTE — Plan of Care (Signed)
  Problem: Health Behavior/Discharge Planning: Goal: Ability to manage health-related needs will improve Outcome: Progressing   

## 2021-12-27 NOTE — Anesthesia Postprocedure Evaluation (Signed)
Anesthesia Post Note  Patient: Sharon Sparks  Procedure(s) Performed: NASAL SEPTOPLASTY WITH TURBINATE REDUCTION (Bilateral: Nose)     Patient location during evaluation: PACU Anesthesia Type: General Level of consciousness: awake and alert Pain management: pain level controlled Vital Signs Assessment: post-procedure vital signs reviewed and stable Respiratory status: spontaneous breathing, nonlabored ventilation, respiratory function stable and patient connected to nasal cannula oxygen Cardiovascular status: blood pressure returned to baseline and stable Postop Assessment: no apparent nausea or vomiting Anesthetic complications: no   No notable events documented.  Last Vitals:  Vitals:   12/27/21 1345 12/27/21 1400  BP: (!) 151/81 (!) 148/80  Pulse: 78 80  Resp: 13 15  Temp:    SpO2:      Last Pain:  Vitals:   12/27/21 1315  TempSrc:   PainSc: Asleep                 Collene Schlichter

## 2021-12-27 NOTE — Anesthesia Preprocedure Evaluation (Addendum)
Anesthesia Evaluation  Patient identified by MRN, date of birth, ID band Patient awake  General Assessment Comment:Nasal septal deviation; Nasal turbinate hypertrophy  Reviewed: Allergy & Precautions, NPO status , Patient's Chart, lab work & pertinent test results  History of Anesthesia Complications (+) PROLONGED EMERGENCE and history of anesthetic complications  Airway Mallampati: IV  TM Distance: >3 FB Neck ROM: Full    Dental  (+) Teeth Intact, Dental Advisory Given Upper and lower braces:   Pulmonary asthma , sleep apnea and Continuous Positive Airway Pressure Ventilation ,    Pulmonary exam normal breath sounds clear to auscultation       Cardiovascular Exercise Tolerance: Good negative cardio ROS Normal cardiovascular exam Rhythm:Regular Rate:Normal     Neuro/Psych PSYCHIATRIC DISORDERS Anxiety negative neurological ROS     GI/Hepatic Neg liver ROS, hiatal hernia, GERD  ,  Endo/Other  Morbid obesity  Renal/GU negative Renal ROS     Musculoskeletal negative musculoskeletal ROS (+)   Abdominal   Peds  Hematology negative hematology ROS (+)   Anesthesia Other Findings Day of surgery medications reviewed with the patient.  Reproductive/Obstetrics                            Anesthesia Physical Anesthesia Plan  ASA: 3  Anesthesia Plan: General   Post-op Pain Management: Tylenol PO (pre-op)*   Induction: Intravenous  PONV Risk Score and Plan: 4 or greater and Midazolam, Dexamethasone and Ondansetron  Airway Management Planned: Oral ETT and Video Laryngoscope Planned  Additional Equipment:   Intra-op Plan:   Post-operative Plan: Extubation in OR  Informed Consent: I have reviewed the patients History and Physical, chart, labs and discussed the procedure including the risks, benefits and alternatives for the proposed anesthesia with the patient or authorized representative who  has indicated his/her understanding and acceptance.     Dental advisory given  Plan Discussed with: CRNA  Anesthesia Plan Comments:        Anesthesia Quick Evaluation

## 2021-12-28 ENCOUNTER — Encounter (HOSPITAL_COMMUNITY): Payer: Self-pay | Admitting: Otolaryngology

## 2021-12-28 DIAGNOSIS — J342 Deviated nasal septum: Secondary | ICD-10-CM | POA: Diagnosis not present

## 2021-12-28 MED ORDER — OXYMETAZOLINE HCL 0.05 % NA SOLN
2.0000 | Freq: Two times a day (BID) | NASAL | Status: DC | PRN
  Administered 2021-12-28: 2 via NASAL
  Filled 2021-12-28: qty 30

## 2021-12-28 MED ORDER — DIPHENHYDRAMINE HCL 25 MG PO CAPS
50.0000 mg | ORAL_CAPSULE | Freq: Four times a day (QID) | ORAL | Status: DC | PRN
  Administered 2021-12-28: 50 mg via ORAL
  Filled 2021-12-28: qty 2

## 2021-12-28 NOTE — Discharge Summary (Signed)
Physician Discharge Summary  Patient ID: Sharon Sparks MRN: [id number] DOB/AGE: 1966/05/18 56 y.o.  Admit date: 12/27/2021 Discharge date: 12/28/2021  Admission Diagnoses: Sleep apnea and nasal obstruction  Discharge Diagnoses:  Principal Problem:   Obstructive sleep apnea   Discharged Condition: good  Hospital Course: No complications, did well overnight.  No difficulty breathing.  Minimal bleeding.  Pain controlled.  Consults: none  Significant Diagnostic Studies: none  Treatments: surgery: Nasal septoplasty and bilateral turbinate reduction  Discharge Exam: Blood pressure (!) 127/54, pulse 81, temperature 97.7 F (36.5 C), temperature source Oral, resp. rate 16, height 5\' 4"  (1.626 m), weight 107 kg, SpO2 97 %. PHYSICAL EXAM: Awake and alert, breathing well through mouth.  Packing in place.  No bleeding or drainage.  Disposition: Discharge disposition: 01-Home or Self Care       Discharge Instructions     Change dressing (specify)   Complete by: As directed    Replace dressing as needed   Diet - low sodium heart healthy   Complete by: As directed    Increase activity slowly   Complete by: As directed       Allergies as of 12/28/2021   Not on File      Medication List     TAKE these medications    benzonatate 100 MG capsule Commonly known as: TESSALON Take 1 capsule (100 mg total) by mouth every 8 (eight) hours.   benzonatate 100 MG capsule Commonly known as: TESSALON Take 1 capsule (100 mg total) by mouth every 8 (eight) hours.   buPROPion 300 MG 24 hr tablet Commonly known as: WELLBUTRIN XL Take 300 mg by mouth every morning.   HYDROcodone-acetaminophen 7.5-325 MG tablet Commonly known as: Norco Take 1 tablet by mouth every 6 (six) hours as needed for moderate pain.   levocetirizine 5 MG tablet Commonly known as: XYZAL Take 5 mg by mouth at bedtime.   ondansetron 4 MG disintegrating tablet Commonly known as: ZOFRAN-ODT Take 1 tablet (4  mg total) by mouth every 8 (eight) hours as needed for nausea or vomiting.   OVER THE COUNTER MEDICATION Take 1 Package by mouth daily. [employer] vitamin Pack   promethazine-dextromethorphan 6.25-15 MG/5ML syrup Commonly known as: PROMETHAZINE-DM Take 5 mLs by mouth 2 (two) times daily as needed for cough.               Discharge Care Instructions  (From admission, onward)           Start     Ordered   12/28/21 0000  Change dressing (specify)       Comments: Replace dressing as needed   12/28/21 12/30/21            Follow-up Information     1601, MD. Schedule an appointment as soon as possible for a visit on 12/29/2021.   Specialty: Otolaryngology Contact information: [address] [place] [phone]         [phone], MD Follow up on 12/29/2021.   Specialty: Otolaryngology Why: Come to the office at 1 PM for packing removal Contact information: [address] [place] [phone]                 Signed: [phone] 12/28/2021, 8:40 AM

## 2021-12-28 NOTE — Plan of Care (Signed)
Patient ID: Sharon Sparks, female   DOB: 01/16/1966, 56 y.o.   MRN: [id number]  Problem: Education: Goal: Knowledge of General Education information will improve Description: Including pain rating scale, medication(s)/side effects and non-pharmacologic comfort measures Outcome: Adequate for Discharge   Problem: Health Behavior/Discharge Planning: Goal: Ability to manage health-related needs will improve Outcome: Adequate for Discharge   Problem: Clinical Measurements: Goal: Ability to maintain clinical measurements within normal limits will improve Outcome: Adequate for Discharge Goal: Will remain free from infection Outcome: Adequate for Discharge Goal: Diagnostic test results will improve Outcome: Adequate for Discharge Goal: Respiratory complications will improve Outcome: Adequate for Discharge Goal: Cardiovascular complication will be avoided Outcome: Adequate for Discharge   Problem: Activity: Goal: Risk for activity intolerance will decrease Outcome: Adequate for Discharge   Problem: Nutrition: Goal: Adequate nutrition will be maintained Outcome: Adequate for Discharge   Problem: Coping: Goal: Level of anxiety will decrease Outcome: Adequate for Discharge   Problem: Elimination: Goal: Will not experience complications related to bowel motility Outcome: Adequate for Discharge Goal: Will not experience complications related to urinary retention Outcome: Adequate for Discharge   Problem: Pain Managment: Goal: General experience of comfort will improve Outcome: Adequate for Discharge   Problem: Safety: Goal: Ability to remain free from injury will improve Outcome: Adequate for Discharge   Problem: Skin Integrity: Goal: Risk for impaired skin integrity will decrease Outcome: Adequate for Discharge    .memd

## 2021-12-28 NOTE — Discharge Instructions (Signed)
Come to the office at 1 PM Friday for packing removal.

## 2022-10-05 ENCOUNTER — Encounter (HOSPITAL_BASED_OUTPATIENT_CLINIC_OR_DEPARTMENT_OTHER): Payer: Self-pay

## 2022-10-05 ENCOUNTER — Other Ambulatory Visit: Payer: Self-pay

## 2022-10-05 ENCOUNTER — Emergency Department (HOSPITAL_BASED_OUTPATIENT_CLINIC_OR_DEPARTMENT_OTHER)
Admission: EM | Admit: 2022-10-05 | Discharge: 2022-10-05 | Disposition: A | Discharge status: Home / Self Care | Payer: Medicaid Other | Attending: Emergency Medicine | Admitting: Emergency Medicine

## 2022-10-05 ENCOUNTER — Emergency Department (HOSPITAL_BASED_OUTPATIENT_CLINIC_OR_DEPARTMENT_OTHER): Payer: Medicaid Other | Admitting: Radiology

## 2022-10-05 DIAGNOSIS — R0781 Pleurodynia: Secondary | ICD-10-CM | POA: Diagnosis not present

## 2022-10-05 DIAGNOSIS — W19XXXA Unspecified fall, initial encounter: Secondary | ICD-10-CM

## 2022-10-05 DIAGNOSIS — M25561 Pain in right knee: Secondary | ICD-10-CM | POA: Diagnosis not present

## 2022-10-05 DIAGNOSIS — M25562 Pain in left knee: Secondary | ICD-10-CM | POA: Diagnosis not present

## 2022-10-05 DIAGNOSIS — S0990XA Unspecified injury of head, initial encounter: Secondary | ICD-10-CM

## 2022-10-05 DIAGNOSIS — W1830XA Fall on same level, unspecified, initial encounter: Secondary | ICD-10-CM | POA: Insufficient documentation

## 2022-10-05 DIAGNOSIS — M545 Low back pain, unspecified: Secondary | ICD-10-CM | POA: Insufficient documentation

## 2022-10-05 MED ORDER — NAPROXEN 375 MG PO TABS: 375.0000 mg | ORAL_TABLET | Freq: Two times a day (BID) | ORAL | 0 refills | Status: AC

## 2022-10-05 MED ORDER — METHOCARBAMOL 500 MG PO TABS: 500.0000 mg | ORAL_TABLET | Freq: Three times a day (TID) | ORAL | 0 refills | Status: AC | PRN
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
## 2022-10-05 NOTE — Discharge Instructions (Addendum)
Your imaging was negative  You appear to have concussion symptoms.  Get help right away if: You have: A severe headache that is not helped by medicine. Trouble walking or weakness in your arms and legs. Clear or bloody fluid coming from your nose or ears. Changes in your vision. A seizure. Increased confusion or irritability. Your symptoms get worse. You are sleepier than normal and have trouble staying awake. You lose your balance. Your pupils change size. Your speech is slurred. Your dizziness gets worse. You vomit. These symptoms may represent a serious problem that is an emergency. Do not wait to see if the symptoms will go away. Get medical help right away. Call your local emergency services (911 in the [place] Do not drive yourself to the hospital.

## 2022-11-28 DIAGNOSIS — G4733 Obstructive sleep apnea (adult) (pediatric): Secondary | ICD-10-CM | POA: Diagnosis not present

## 2022-12-05 DIAGNOSIS — Z131 Encounter for screening for diabetes mellitus: Secondary | ICD-10-CM | POA: Diagnosis not present

## 2022-12-05 DIAGNOSIS — I471 Supraventricular tachycardia, unspecified: Secondary | ICD-10-CM | POA: Diagnosis not present

## 2022-12-05 DIAGNOSIS — I209 Angina pectoris, unspecified: Secondary | ICD-10-CM | POA: Diagnosis not present

## 2022-12-05 DIAGNOSIS — R7301 Impaired fasting glucose: Secondary | ICD-10-CM | POA: Diagnosis not present

## 2022-12-18 IMAGING — RF DG ESOPHAGUS
6 series · 14 of 24 positions shown · non-contrast
Comparison: None.

CLINICAL DATA: Obstructive sleep apnea on CPAP. Pharyngoesophageal
dysphagia. Laryngopharyngeal reflux

EXAM:
ESOPHOGRAM / BARIUM SWALLOW / BARIUM TABLET STUDY
TECHNIQUE: Combined double contrast and single contrast examination performed
using effervescent crystals, thick barium liquid, and thin barium
liquid. The patient was observed with fluoroscopy swallowing a 13 mm
barium sulphate tablet.
FLUOROSCOPY:
Radiation Exposure Index (as provided by the fluoroscopic device):
13 mGy Kerma

[Series 1: sequence · 2 of 16 frames shown (1 of 4)]
[frame 3/16]
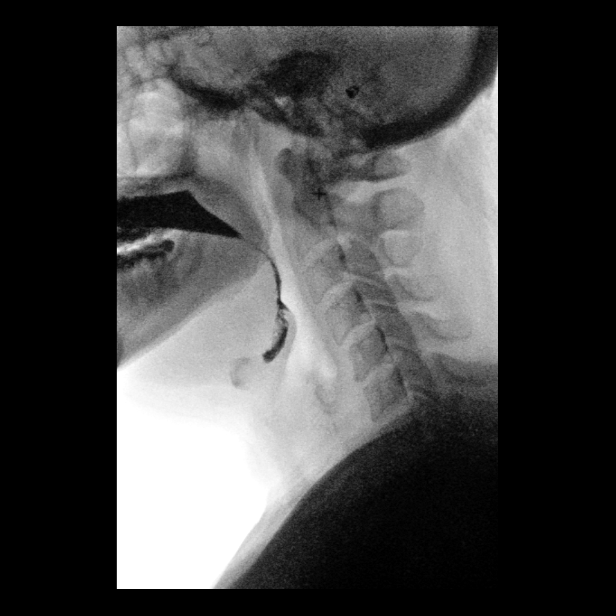
[frame 11/16]
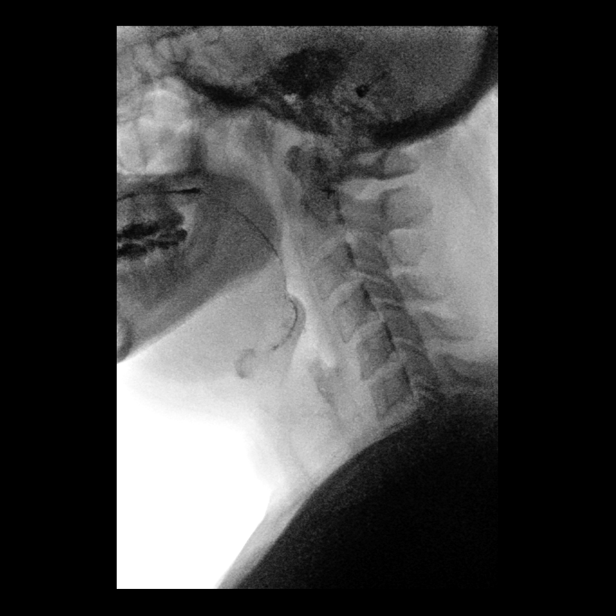

[Series 2: sequence · 2 of 18 frames shown (2 of 4)]
[frame 3/18]
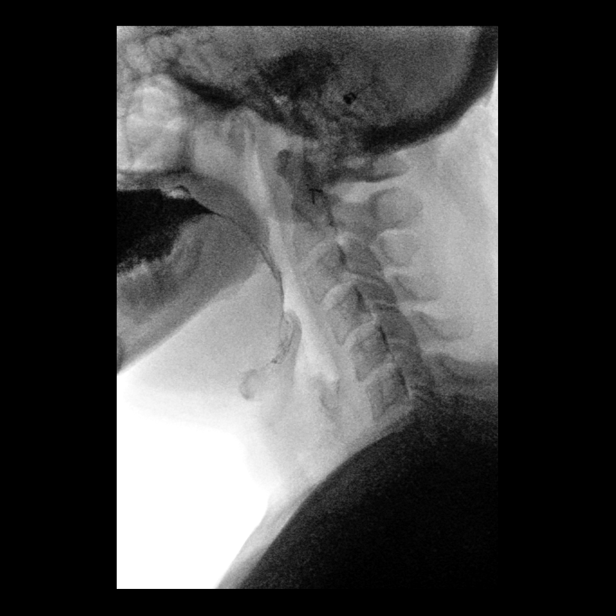
[frame 16/18]
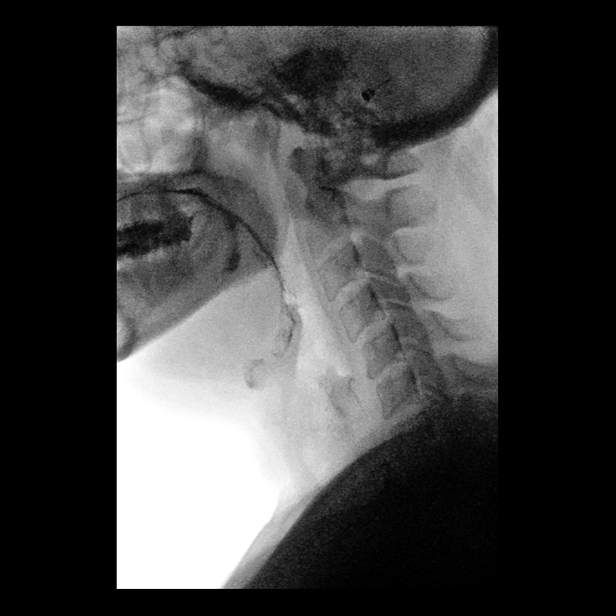

[Series 3: sequence · 2 of 15 frames shown (3 of 4)]
[frame 1/15]
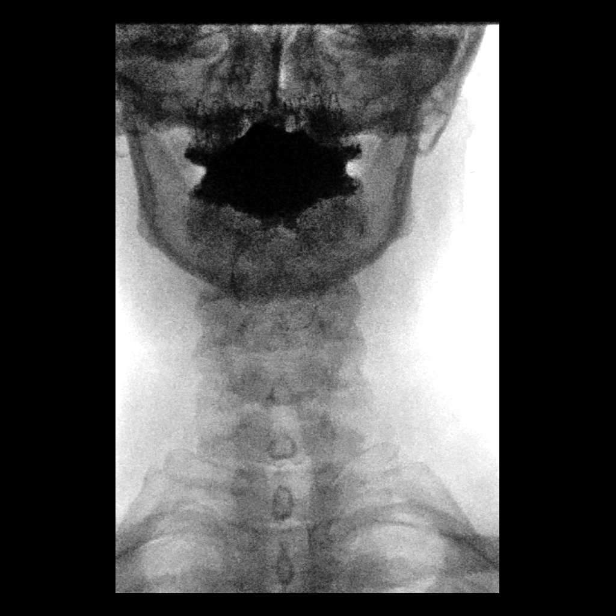
[frame 8/15]
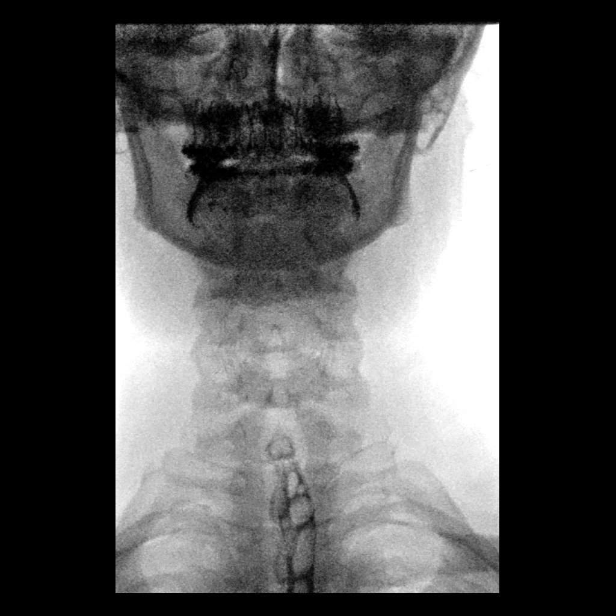

[Series 4: one shot · 0.15mm/px · 2 of 2 slices shown (1 of 2)]
[im 1/2]
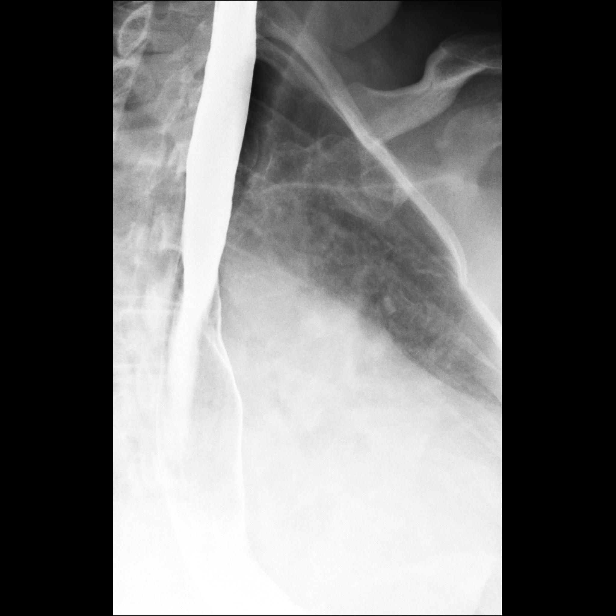
[im 2/2]
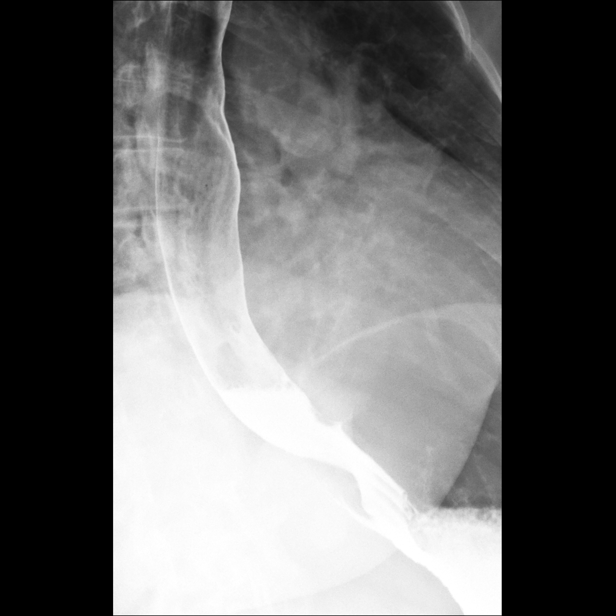

[Series 5: sequence · 2 of 49 frames shown (4 of 4)]
[frame 12/49]
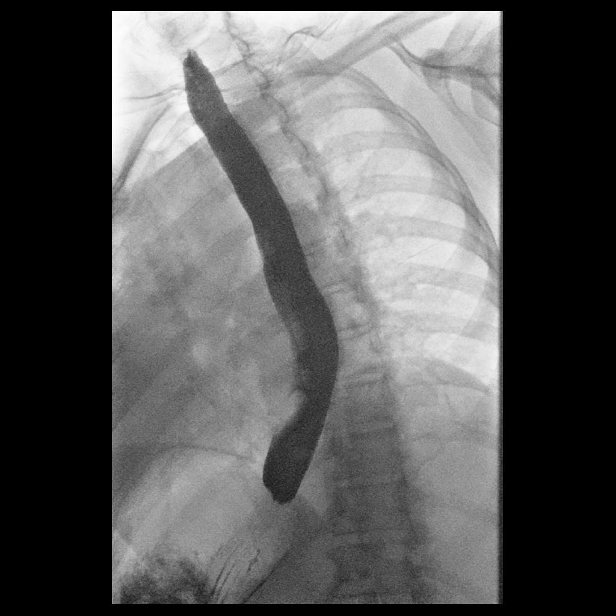
[frame 42/49]
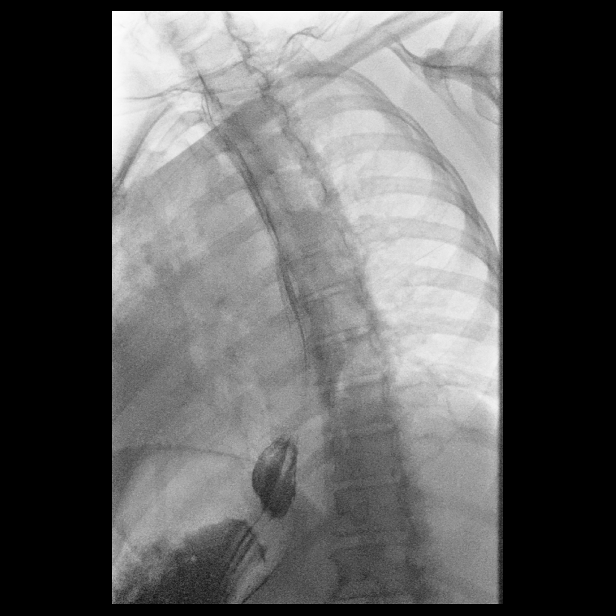

[Series 6: one shot · 0.14mm/px · 4 of 7 slices shown (2 of 2)]
[im 2/7]
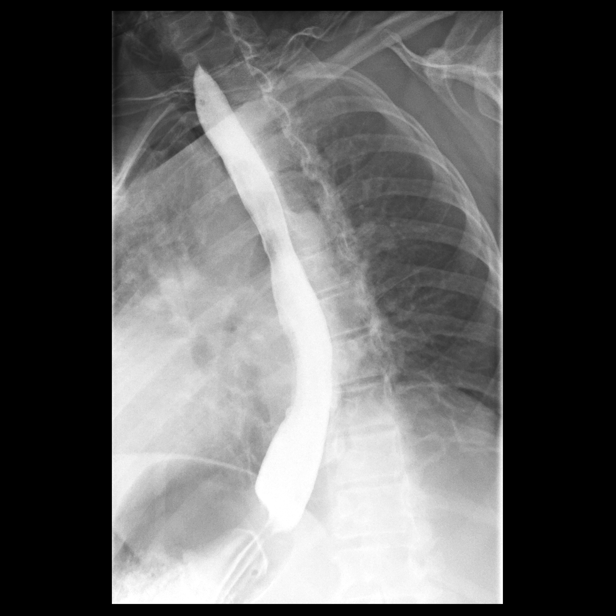
[im 3/7]
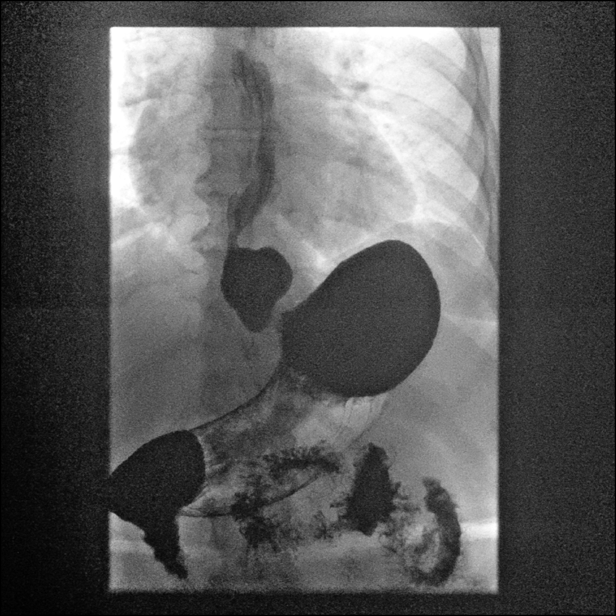
[im 5/7]
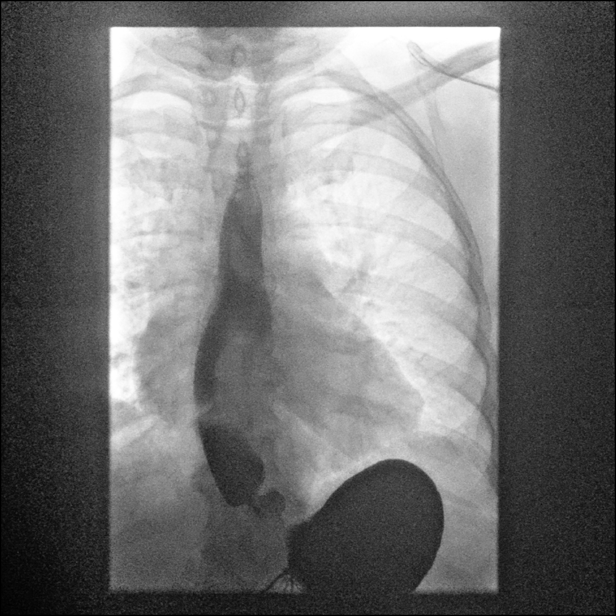
[im 7/7]
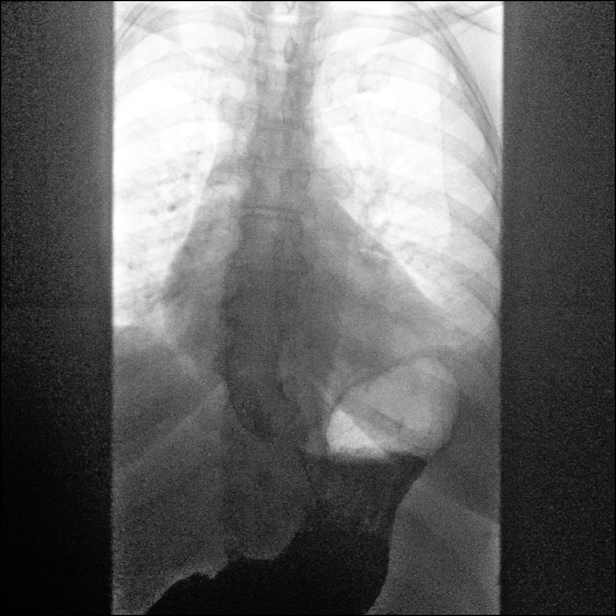

[14 of 24 positions shown; findings below may reference images not displayed]

FINDINGS: Cervical esophagram: No aspiration or penetration. No diverticulum.
No stricture. Swallowing is normal.

Thoracic esophagram: The esophagus is normal in course and
morphology. There is no visible ulceration. There is no evidence of
stricture. There is a small hiatal hernia. There is severe
gastroesophageal reflux observed nearly to the level of the thoracic
inlet. A 13 mm barium tablet passed without difficulty into the
stomach.
IMPRESSION: Severe gastroesophageal reflux observed nearly to the level of the
thoracic inlet. Small hiatal hernia.

No visible ulceration.  No evidence of stricture.

## 2023-02-08 ENCOUNTER — Ambulatory Visit: Payer: Managed Care, Other (non HMO) | Admitting: Internal Medicine

## 2023-03-05 DIAGNOSIS — M9903 Segmental and somatic dysfunction of lumbar region: Secondary | ICD-10-CM | POA: Diagnosis not present

## 2023-03-05 DIAGNOSIS — M5386 Other specified dorsopathies, lumbar region: Secondary | ICD-10-CM | POA: Diagnosis not present

## 2023-03-11 DIAGNOSIS — M5386 Other specified dorsopathies, lumbar region: Secondary | ICD-10-CM | POA: Diagnosis not present

## 2023-03-11 DIAGNOSIS — M9903 Segmental and somatic dysfunction of lumbar region: Secondary | ICD-10-CM | POA: Diagnosis not present

## 2023-03-13 DIAGNOSIS — M9903 Segmental and somatic dysfunction of lumbar region: Secondary | ICD-10-CM | POA: Diagnosis not present

## 2023-03-13 DIAGNOSIS — M5386 Other specified dorsopathies, lumbar region: Secondary | ICD-10-CM | POA: Diagnosis not present

## 2023-03-18 DIAGNOSIS — M5386 Other specified dorsopathies, lumbar region: Secondary | ICD-10-CM | POA: Diagnosis not present

## 2023-03-18 DIAGNOSIS — M9903 Segmental and somatic dysfunction of lumbar region: Secondary | ICD-10-CM | POA: Diagnosis not present

## 2023-03-20 DIAGNOSIS — M5386 Other specified dorsopathies, lumbar region: Secondary | ICD-10-CM | POA: Diagnosis not present

## 2023-03-20 DIAGNOSIS — M9903 Segmental and somatic dysfunction of lumbar region: Secondary | ICD-10-CM | POA: Diagnosis not present

## 2023-03-27 DIAGNOSIS — M5386 Other specified dorsopathies, lumbar region: Secondary | ICD-10-CM | POA: Diagnosis not present

## 2023-03-27 DIAGNOSIS — M9903 Segmental and somatic dysfunction of lumbar region: Secondary | ICD-10-CM | POA: Diagnosis not present

## 2023-03-28 DIAGNOSIS — M25562 Pain in left knee: Secondary | ICD-10-CM | POA: Diagnosis not present

## 2023-04-01 DIAGNOSIS — M5386 Other specified dorsopathies, lumbar region: Secondary | ICD-10-CM | POA: Diagnosis not present

## 2023-04-01 DIAGNOSIS — M9903 Segmental and somatic dysfunction of lumbar region: Secondary | ICD-10-CM | POA: Diagnosis not present

## 2023-04-10 DIAGNOSIS — M9903 Segmental and somatic dysfunction of lumbar region: Secondary | ICD-10-CM | POA: Diagnosis not present

## 2023-04-10 DIAGNOSIS — M5386 Other specified dorsopathies, lumbar region: Secondary | ICD-10-CM | POA: Diagnosis not present

## 2024-02-23 ENCOUNTER — Other Ambulatory Visit: Payer: Self-pay

## 2024-02-23 ENCOUNTER — Emergency Department (HOSPITAL_BASED_OUTPATIENT_CLINIC_OR_DEPARTMENT_OTHER)
Admission: EM | Admit: 2024-02-23 | Discharge: 2024-02-23 | Disposition: A | Discharge status: Home / Self Care | Attending: Emergency Medicine | Admitting: Emergency Medicine

## 2024-02-23 ENCOUNTER — Encounter (HOSPITAL_BASED_OUTPATIENT_CLINIC_OR_DEPARTMENT_OTHER): Payer: Self-pay

## 2024-02-23 ENCOUNTER — Emergency Department (HOSPITAL_BASED_OUTPATIENT_CLINIC_OR_DEPARTMENT_OTHER)

## 2024-02-23 DIAGNOSIS — R0789 Other chest pain: Secondary | ICD-10-CM | POA: Diagnosis not present

## 2024-02-23 DIAGNOSIS — R079 Chest pain, unspecified: Secondary | ICD-10-CM | POA: Diagnosis present

## 2024-02-23 NOTE — Discharge Instructions (Addendum)
 Call your family doctor tomorrow to let them know about your visit and see if they can see you in the office.  I think most likely strained a muscle to your chest wall.  Please follow-up with your family doctor in the office.  Because this is in or adjacent to the breast tissue I have placed an order for you to be seen at the breast center.  Take 4 over the counter ibuprofen  tablets 3 times a day or 2 over-the-counter naproxen  tablets twice a day for pain. Also take tylenol  1000mg (2 extra strength) four times a day.

## 2024-02-23 NOTE — ED Triage Notes (Signed)
 Patient here POV from Home.  Noted a Lump or mass to right chest with associated pain last PM. No other associated symptoms such as fever, cough, SOB, N/V/D. Tender and constant. Certain movements exacerbate pain.   NAD noted during Triage. A&Ox4. Gcs 15. Ambulatory.

## 2024-02-23 NOTE — ED Provider Notes (Signed)
 Sharon Sparks Provider Note   CSN: [id number] Arrival date & time: 02/23/24  9285     Patient presents with: Chest Pain   Sharon Sparks is a 58 y.o. female.   58 yo F with a chief complaints of right-sided chest pain.  Noticed last night and feels like there is a lump there.  This is at the superior aspect of her breast.  She denies trauma to the area denies fevers denies cough or congestion.   Chest Pain      Prior to Admission medications   Medication Sig Start Date End Date Taking? Authorizing Provider  benzonatate  (TESSALON ) 100 MG capsule Take 1 capsule (100 mg total) by mouth every 8 (eight) hours. Patient not taking: Reported on 11/23/2021 07/04/21   Redwine, Madison A, PA-C  benzonatate  (TESSALON ) 100 MG capsule Take 1 capsule (100 mg total) by mouth every 8 (eight) hours. Patient not taking: Reported on 11/23/2021 07/04/21   Redwine, Madison A, PA-C  buPROPion  (WELLBUTRIN  XL) 300 MG 24 hr tablet Take 300 mg by mouth every morning. 11/03/21   [provider]  HYDROcodone -acetaminophen  (NORCO) 7.5-325 MG tablet Take 1 tablet by mouth every 6 (six) hours as needed for moderate pain. 12/27/21   Jesus Oliphant, MD  levocetirizine (XYZAL) 5 MG tablet Take 5 mg by mouth at bedtime. 11/03/21   [provider]  methocarbamol  (ROBAXIN ) 500 MG tablet Take 1 tablet (500 mg total) by mouth 3 (three) times daily as needed for muscle spasms. 10/05/22   Harris, Abigail, PA-C  naproxen  (NAPROSYN ) 375 MG tablet Take 1 tablet (375 mg total) by mouth 2 (two) times daily with a meal. 10/05/22   Harris, Abigail, PA-C  ondansetron  (ZOFRAN -ODT) 4 MG disintegrating tablet Take 1 tablet (4 mg total) by mouth every 8 (eight) hours as needed for nausea or vomiting. 12/27/21   Jesus Oliphant, MD  OVER THE COUNTER MEDICATION Take 1 Package by mouth daily. [employer] vitamin Pack    [provider]  promethazine -dextromethorphan (PROMETHAZINE -DM) 6.25-15 MG/5ML  syrup Take 5 mLs by mouth 2 (two) times daily as needed for cough. Patient not taking: Reported on 07/04/2021 10/21/20   Raspet, Erin K, PA-C    Allergies: Patient has no known allergies.    Review of Systems  Cardiovascular:  Positive for chest pain.    Updated Vital Signs BP 139/72 (BP Location: Right Arm)   Pulse 90   Temp 98.3 F (36.8 C) (Oral)   Resp 18   Ht 5' 4 (1.626 m)   Wt 104.3 kg   SpO2 98%   BMI 39.48 kg/m   Physical Exam Vitals and nursing note reviewed.  Constitutional:      General: She is not in acute distress.    Appearance: She is well-developed. She is not diaphoretic.  HENT:     Head: Normocephalic and atraumatic.  Eyes:     Pupils: Pupils are equal, round, and reactive to light.  Cardiovascular:     Rate and Rhythm: Normal rate and regular rhythm.     Heart sounds: No murmur heard.    No friction rub. No gallop.  Pulmonary:     Effort: Pulmonary effort is normal.     Breath sounds: No wheezing or rales.  Chest:     Comments: There is a painful mobile palpable lump.  Just at the upper aspect of the breast tissue of the right breast at the 12 o'clock position.  Feels more adjacent to the  ribs and the breast tissue.  No erythema no fluctuance.  No induration. Abdominal:     General: There is no distension.     Palpations: Abdomen is soft.     Tenderness: There is no abdominal tenderness.  Musculoskeletal:        General: No tenderness.     Cervical back: Normal range of motion and neck supple.  Skin:    General: Skin is warm and dry.  Neurological:     Mental Status: She is alert and oriented to person, place, and time.  Psychiatric:        Behavior: Behavior normal.     (all labs ordered are listed, but only abnormal results are displayed) Labs Reviewed - No data to display  EKG: None  Radiology: DG Chest Portable 1 View Result Date: 02/23/2024 EXAM: 1 VIEW XRAY OF THE CHEST 02/23/2024 07:40:55 AM COMPARISON: None available. CLINICAL  HISTORY: Chest pain. FINDINGS: LUNGS AND PLEURA: No focal pulmonary opacity. No pulmonary edema. No pleural effusion. No pneumothorax. HEART AND MEDIASTINUM: No acute abnormality of the cardiac and mediastinal silhouettes. BONES AND SOFT TISSUES: No acute osseous abnormality. IMPRESSION: 1. No acute process. Electronically signed by: Dayne Hassell MD 02/23/2024 07:55 AM EDT RP Workstation: [id number]     Procedures   Medications Ordered in the ED - No data to display                                  Medical Decision Making Amount and/or Complexity of Data Reviewed Radiology: ordered. ECG/medicine tests: ordered.   58 yo F with a chief complaint today of right sided chest pain.  I think most likely the patient has muscular strain.  It is however adjacent to or within the breast tissue.  Will have her follow-up with the breast center.  Chest x-ray independently interpreted by me without pneumothorax or focal infiltrate.  EKG without ischemia.  Treat as musculoskeletal.  7:57 AM:  I have discussed the diagnosis/risks/treatment options with the patient.  Evaluation and diagnostic testing in the emergency department does not suggest an emergent condition requiring admission or immediate intervention beyond what has been performed at this time.  They will follow up with PCP. We also discussed returning to the ED immediately if new or worsening sx occur. We discussed the sx which are most concerning (e.g., sudden worsening pain, fever, inability to tolerate by mouth) that necessitate immediate return. Medications administered to the patient during their visit and any new prescriptions provided to the patient are listed below.  Medications given during this visit Medications - No data to display   The patient appears reasonably screen and/or stabilized for discharge and I doubt any other medical condition or other [facility] requiring further screening, evaluation, or treatment in the ED at this time  prior to discharge.       Final diagnoses:  Chest wall pain    ED Discharge Orders          Ordered    US  BREAST ASPIRATION RIGHT        02/23/24 0755               [name], DO 02/23/24 [phone]

## 2024-03-20 ENCOUNTER — Other Ambulatory Visit: Payer: Self-pay

## 2024-03-20 DIAGNOSIS — Z1231 Encounter for screening mammogram for malignant neoplasm of breast: Secondary | ICD-10-CM

## 2024-04-01 ENCOUNTER — Ambulatory Visit
Admission: RE | Admit: 2024-04-01 | Discharge: 2024-04-01 | Disposition: A | Discharge status: Home / Self Care | Source: Ambulatory Visit

## 2024-04-01 ENCOUNTER — Other Ambulatory Visit: Payer: Self-pay | Admitting: Nurse Practitioner

## 2024-04-01 DIAGNOSIS — Z1231 Encounter for screening mammogram for malignant neoplasm of breast: Secondary | ICD-10-CM
